# Patient Record
Sex: Male | Born: 2000 | Race: Black or African American | Hispanic: No | Marital: Single | Smoking: Never smoker
Health system: Southern US, Community
[De-identification: ages and names within clinical notes are randomized; demographics above are authoritative.]

---
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
## 2021-01-02 ENCOUNTER — Emergency Department (HOSPITAL_BASED_OUTPATIENT_CLINIC_OR_DEPARTMENT_OTHER): Payer: Medicaid Other

## 2021-01-02 ENCOUNTER — Encounter (HOSPITAL_BASED_OUTPATIENT_CLINIC_OR_DEPARTMENT_OTHER): Payer: Self-pay

## 2021-01-02 ENCOUNTER — Other Ambulatory Visit: Payer: Self-pay

## 2021-01-02 ENCOUNTER — Emergency Department (HOSPITAL_BASED_OUTPATIENT_CLINIC_OR_DEPARTMENT_OTHER)
Admission: EM | Admit: 2021-01-02 | Discharge: 2021-01-02 | Disposition: A | Discharge status: Home / Self Care | Payer: Medicaid Other | Attending: Student | Admitting: Student

## 2021-01-02 DIAGNOSIS — W19XXXA Unspecified fall, initial encounter: Secondary | ICD-10-CM | POA: Insufficient documentation

## 2021-01-02 DIAGNOSIS — Y9302 Activity, running: Secondary | ICD-10-CM | POA: Insufficient documentation

## 2021-01-02 DIAGNOSIS — M25561 Pain in right knee: Secondary | ICD-10-CM

## 2021-01-02 DIAGNOSIS — S8991XA Unspecified injury of right lower leg, initial encounter: Secondary | ICD-10-CM | POA: Diagnosis present

## 2021-01-02 DIAGNOSIS — S8990XA Unspecified injury of unspecified lower leg, initial encounter: Secondary | ICD-10-CM

## 2021-01-02 MED ORDER — IBUPROFEN 400 MG PO TABS
600.0000 mg | ORAL_TABLET | Freq: Once | ORAL | Status: AC
  Administered 2021-01-02: 600 mg via ORAL
  Filled 2021-01-02: qty 1

## 2021-01-02 NOTE — ED Triage Notes (Signed)
Pt reports falling yesterday and injuring right knee

## 2021-01-02 NOTE — ED Provider Notes (Signed)
[facility] EMERGENCY DEPARTMENT Provider Note   CSN: [id number] Arrival date & time: 01/02/21  0848     History Chief Complaint  Patient presents with   Knee Injury    Marc Atkins is a 20 y.o. male who presents emergency department for evaluation of right knee pain.  Patient states that yesterday he was running when he fell onto the kneecap on the right.  He states that he is able to bear weight on the leg but is limited by pain.  His pain is isolated to the kneecap with no surrounding pain, no evidence of external trauma.  No significant knee effusion.  Denies numbness, tingling, weakness of the lower extremity.  Pulses intact.  HPI     History reviewed. No pertinent past medical history.  There are no problems to display for this patient.   History reviewed. No pertinent surgical history.     Family History  Problem Relation Age of Onset   Healthy Mother    Healthy Father     Social History   Tobacco Use   Smoking status: Never  Vaping Use   Vaping Use: Never used  Substance Use Topics   Alcohol use: Never   Drug use: Never    Home Medications Prior to Admission medications   Medication Sig Start Date End Date Taking? Authorizing Provider  naproxen (NAPROSYN) 375 MG tablet Take 1 tablet (375 mg total) by mouth 2 (two) times daily. 12/07/17   Wurst, [place], PA-C    Allergies    Patient has no known allergies.  Review of Systems   Review of Systems  Constitutional:  Negative for chills and fever.  HENT:  Negative for ear pain and sore throat.   Eyes:  Negative for pain and visual disturbance.  Respiratory:  Negative for cough and shortness of breath.   Cardiovascular:  Negative for chest pain and palpitations.  Gastrointestinal:  Negative for abdominal pain and vomiting.  Genitourinary:  Negative for dysuria and hematuria.  Musculoskeletal:  Positive for arthralgias. Negative for back pain.  Skin:  Negative for color change and rash.   Neurological:  Negative for seizures and syncope.  All other systems reviewed and are negative.  Physical Exam Updated Vital Signs BP 104/72 (BP Location: Right Arm)   Pulse 67   Temp 98.6 F (37 C)   Resp 16   Ht 5\' 11"  (1.803 m)   SpO2 97%   Physical Exam Vitals and nursing note reviewed.  Constitutional:      General: He is not in acute distress.    Appearance: He is well-developed.  HENT:     Head: Normocephalic and atraumatic.  Eyes:     Conjunctiva/sclera: Conjunctivae normal.  Cardiovascular:     Rate and Rhythm: Normal rate and regular rhythm.     Heart sounds: No murmur heard. Pulmonary:     Effort: Pulmonary effort is normal. No respiratory distress.     Breath sounds: Normal breath sounds.  Abdominal:     Palpations: Abdomen is soft.     Tenderness: There is no abdominal tenderness.  Musculoskeletal:        General: Tenderness (Right patella) present. No swelling.     Cervical back: Neck supple.  Skin:    General: Skin is warm and dry.     Capillary Refill: Capillary refill takes less than 2 seconds.  Neurological:     Mental Status: He is alert.  Psychiatric:        Mood  and Affect: Mood normal.    ED Results / Procedures / Treatments   Labs (all labs ordered are listed, but only abnormal results are displayed) Labs Reviewed - No data to display  EKG None  Radiology No results found.  Procedures Procedures   Medications Ordered in ED Medications  ibuprofen (ADVIL) tablet 600 mg (has no administration in time range)    ED Course  I have reviewed the triage vital signs and the nursing notes.  Pertinent labs & imaging results that were available during my care of the patient were reviewed by me and considered in my medical decision making (see chart for details).    MDM Rules/Calculators/A&P                           Patient seen emergency department for evaluation of right knee pain.  Physical exam reveals mild tenderness over the  patella of the right knee but no appreciable effusion, patient has full range of motion of the knee.  X-ray with no fracture.  Patient given ibuprofen for pain control and an Ace wrap applied.  Patient able to ambulate upon time of discharge.  Patient then discharged with outpatient PCP follow-up. Final Clinical Impression(s) / ED Diagnoses Final diagnoses:  None    Rx / DC Orders ED Discharge Orders     None        Jeneal Vogl, Wyn Forster, MD 01/02/21 1545

## 2021-03-03 ENCOUNTER — Emergency Department (HOSPITAL_BASED_OUTPATIENT_CLINIC_OR_DEPARTMENT_OTHER)
Admission: EM | Admit: 2021-03-03 | Discharge: 2021-03-03 | Disposition: A | Discharge status: Home / Self Care | Payer: Medicaid Other | Attending: Emergency Medicine | Admitting: Emergency Medicine

## 2021-03-03 ENCOUNTER — Encounter (HOSPITAL_BASED_OUTPATIENT_CLINIC_OR_DEPARTMENT_OTHER): Payer: Self-pay

## 2021-03-03 ENCOUNTER — Other Ambulatory Visit: Payer: Self-pay

## 2021-03-03 ENCOUNTER — Emergency Department (HOSPITAL_BASED_OUTPATIENT_CLINIC_OR_DEPARTMENT_OTHER): Payer: Medicaid Other

## 2021-03-03 DIAGNOSIS — R112 Nausea with vomiting, unspecified: Secondary | ICD-10-CM | POA: Diagnosis present

## 2021-03-03 DIAGNOSIS — Z20822 Contact with and (suspected) exposure to covid-19: Secondary | ICD-10-CM | POA: Insufficient documentation

## 2021-03-03 DIAGNOSIS — E860 Dehydration: Secondary | ICD-10-CM | POA: Diagnosis not present

## 2021-03-03 DIAGNOSIS — R1084 Generalized abdominal pain: Secondary | ICD-10-CM | POA: Insufficient documentation

## 2021-03-03 DIAGNOSIS — N179 Acute kidney failure, unspecified: Secondary | ICD-10-CM | POA: Diagnosis not present

## 2021-03-03 LAB — CBC WITH DIFFERENTIAL/PLATELET
Abs Immature Granulocytes: 0.08 10*3/uL — ABNORMAL HIGH (ref 0.00–0.07)
Basophils Absolute: 0.1 10*3/uL (ref 0.0–0.1)
Basophils Relative: 0 %
Eosinophils Absolute: 0.1 10*3/uL (ref 0.0–0.5)
Eosinophils Relative: 1 %
HCT: 48.9 % (ref 39.0–52.0)
Hemoglobin: 15.5 g/dL (ref 13.0–17.0)
Immature Granulocytes: 0 %
Lymphocytes Relative: 8 %
Lymphs Abs: 1.4 10*3/uL (ref 0.7–4.0)
MCH: 25.5 pg — ABNORMAL LOW (ref 26.0–34.0)
MCHC: 31.7 g/dL (ref 30.0–36.0)
MCV: 80.6 fL (ref 80.0–100.0)
Monocytes Absolute: 1.5 10*3/uL — ABNORMAL HIGH (ref 0.1–1.0)
Monocytes Relative: 8 %
Neutro Abs: 14.9 10*3/uL — ABNORMAL HIGH (ref 1.7–7.7)
Neutrophils Relative %: 83 %
Platelets: 361 10*3/uL (ref 150–400)
RBC: 6.07 MIL/uL — ABNORMAL HIGH (ref 4.22–5.81)
RDW: 14.1 % (ref 11.5–15.5)
WBC: 18.1 10*3/uL — ABNORMAL HIGH (ref 4.0–10.5)
nRBC: 0 % (ref 0.0–0.2)

## 2021-03-03 LAB — COMPREHENSIVE METABOLIC PANEL
ALT: 35 U/L (ref 0–44)
AST: 24 U/L (ref 15–41)
Albumin: 4.6 g/dL (ref 3.5–5.0)
Alkaline Phosphatase: 49 U/L (ref 38–126)
Anion gap: 10 (ref 5–15)
BUN: 17 mg/dL (ref 6–20)
CO2: 26 mmol/L (ref 22–32)
Calcium: 9.6 mg/dL (ref 8.9–10.3)
Chloride: 102 mmol/L (ref 98–111)
Creatinine, Ser: 1.25 mg/dL — ABNORMAL HIGH (ref 0.61–1.24)
GFR, Estimated: >60 mL/min (ref >60)
Glucose, Bld: 126 mg/dL — ABNORMAL HIGH (ref 70–99)
Potassium: 3.8 mmol/L (ref 3.5–5.1)
Sodium: 138 mmol/L (ref 135–145)
Total Bilirubin: 0.7 mg/dL (ref 0.3–1.2)
Total Protein: 8.4 g/dL — ABNORMAL HIGH (ref 6.5–8.1)

## 2021-03-03 LAB — LIPASE, BLOOD: Lipase: 26 U/L (ref 11–51)

## 2021-03-03 LAB — RESP PANEL BY RT-PCR (FLU A&B, COVID) ARPGX2
Influenza A by PCR: NEGATIVE
Influenza B by PCR: NEGATIVE
SARS Coronavirus 2 by RT PCR: NEGATIVE

## 2021-03-03 LAB — MAGNESIUM: Magnesium: 1.8 mg/dL (ref 1.7–2.4)

## 2021-03-03 MED ORDER — DIPHENHYDRAMINE HCL 25 MG PO CAPS
25.0000 mg | ORAL_CAPSULE | Freq: Once | ORAL | Status: AC
  Administered 2021-03-03: 25 mg via ORAL
  Filled 2021-03-03: qty 1

## 2021-03-03 MED ORDER — ONDANSETRON HCL 4 MG/2ML IJ SOLN
4.0000 mg | Freq: Once | INTRAMUSCULAR | Status: AC
  Administered 2021-03-03: 4 mg via INTRAVENOUS
  Filled 2021-03-03: qty 2

## 2021-03-03 MED ORDER — SODIUM CHLORIDE 0.9 % IV BOLUS
1000.0000 mL | Freq: Once | INTRAVENOUS | Status: AC
  Administered 2021-03-03: 1000 mL via INTRAVENOUS

## 2021-03-03 MED ORDER — IOHEXOL 300 MG/ML  SOLN
100.0000 mL | Freq: Once | INTRAMUSCULAR | Status: AC | PRN
  Administered 2021-03-03: 100 mL via INTRAVENOUS

## 2021-03-03 MED ORDER — METOCLOPRAMIDE HCL 10 MG PO TABS
10.0000 mg | ORAL_TABLET | Freq: Once | ORAL | Status: AC
Start: 2021-03-03 — End: 2021-03-03
  Administered 2021-03-03: 10 mg via ORAL
  Filled 2021-03-03: qty 1

## 2021-03-03 MED ORDER — ONDANSETRON HCL 4 MG PO TABS: 4.0000 mg | ORAL_TABLET | Freq: Four times a day (QID) | ORAL | 0 refills | Status: AC

## 2021-03-03 NOTE — ED Notes (Signed)
Pt in bed, pt denies pain or nausea, crackers and water given for PO challenge.

## 2021-03-03 NOTE — ED Triage Notes (Signed)
Pt to er room number 6, per ems pt is here for nausea and vomiting since 4am, pt states that he thinks that he has a stomach bug, states that he has vomited about 6 times since 4 am, states that he also has some abd pain.  Pt c/o nausea at this time.

## 2021-03-03 NOTE — ED Notes (Signed)
Pt in bed, pt states that he feels better, denies nausea or pain

## 2021-03-03 NOTE — ED Provider Notes (Signed)
[facility] EM[facility]ENCY DEPARTMENT Provider Note   CSN: [id number] Arrival date & time: 03/03/21  [id number]     History  Chief Complaint  Patient presents with   Emesis    Marc Atkins is a 22 y.o. male.  Pt is a 21 yo male with no significant PMH presenting for nausea and vomiting. Patient admits to generalized abdominal discomfort while vomiting. State he woke up at 4 am vomiting, approx 5-6 times. Denis fevers, chills, or diarrhea. Had canned chili peppers last night before bed. Denies sick contacts. Denies abdominal surgical hx. Denies RLQ pain. Denies marijuana use. Denies alcohol use.   The history is provided by the patient. No language interpreter was used.  Emesis Associated symptoms: abdominal pain   Associated symptoms: no arthralgias, no chills, no cough, no fever and no sore throat       Home Medications Prior to Admission medications   Medication Sig Start Date End Date Taking? Authorizing Provider  naproxen (NAPROSYN) 375 MG tablet Take 1 tablet (375 mg total) by mouth 2 (two) times daily. 12/07/17   Wurst, [place], PA-C      Allergies    Patient has no known allergies.    Review of Systems   Review of Systems  Constitutional:  Negative for chills and fever.  HENT:  Negative for ear pain and sore throat.   Eyes:  Negative for pain and visual disturbance.  Respiratory:  Negative for cough and shortness of breath.   Cardiovascular:  Negative for chest pain and palpitations.  Gastrointestinal:  Positive for abdominal pain, nausea and vomiting.  Genitourinary:  Negative for dysuria and hematuria.  Musculoskeletal:  Negative for arthralgias and back pain.  Skin:  Negative for color change and rash.  Neurological:  Negative for seizures and syncope.  All other systems reviewed and are negative.  Physical Exam Updated Vital Signs BP 117/73 (BP Location: Left Arm)    Pulse 80    Temp 98.5 F (36.9 C) (Oral)    Resp 17    Ht 5\' 11"  (1.803 m)    Wt 97.5  kg    SpO2 100%    BMI 29.99 kg/m  Physical Exam Vitals and nursing note reviewed.  Constitutional:      General: He is not in acute distress.    Appearance: He is well-developed.  HENT:     Head: Normocephalic and atraumatic.  Eyes:     Conjunctiva/sclera: Conjunctivae normal.  Cardiovascular:     Rate and Rhythm: Normal rate and regular rhythm.     Heart sounds: No murmur heard. Pulmonary:     Effort: Pulmonary effort is normal. No respiratory distress.     Breath sounds: Normal breath sounds.  Abdominal:     Palpations: Abdomen is soft.     Tenderness: There is no abdominal tenderness.  Musculoskeletal:        General: No swelling.     Cervical back: Neck supple.  Skin:    General: Skin is warm and dry.     Capillary Refill: Capillary refill takes less than 2 seconds.  Neurological:     Mental Status: He is alert.  Psychiatric:        Mood and Affect: Mood normal.    ED Results / Procedures / Treatments   Labs (all labs ordered are listed, but only abnormal results are displayed) Labs Reviewed - No data to display  EKG None  Radiology No results found.  Procedures Procedures    Medications  Ordered in ED Medications - No data to display  ED Course/ Medical Decision Making/ A&P                           Medical Decision Making Amount and/or Complexity of Data Reviewed Labs: ordered. Radiology: ordered.  Risk Prescription drug management.   7:44 AM 21 yo male with no significant PMH presenting for nausea and vomiting. Pt is [name], no acute distress, afebrile, with stable vitals. Abdomen is soft and on tender.   Stable liver profile and lipase. Pt has AKI likely secondary to dehydration. CT abdomen demonstrates no appendicitis. No acute process on exam. No sepsis. Improvement of symptoms after IVF and zofran. Consider gastroenteritis of unknown cause.   Patient in no distress and overall condition improved here in the ED. Detailed discussions were had  with the patient regarding current findings, and need for close f/u with PCP or on call doctor. The patient has been instructed to return immediately if the symptoms worsen in any way for re-evaluation. Patient verbalized understanding and is in agreement with current care plan. All questions answered prior to discharge.         Final Clinical Impression(s) / ED Diagnoses Final diagnoses:  Nausea and vomiting, unspecified vomiting type  Dehydration  AKI (acute kidney injury) [facility])    Rx / DC Orders ED Discharge Orders     None         Lianne Cure, DO [id number]

## 2021-03-03 NOTE — ED Notes (Signed)
Pt in bed, pt vomited a large amount of green liquid.

## 2022-06-02 ENCOUNTER — Other Ambulatory Visit: Payer: Self-pay

## 2022-06-02 ENCOUNTER — Emergency Department (HOSPITAL_BASED_OUTPATIENT_CLINIC_OR_DEPARTMENT_OTHER)
Admission: EM | Admit: 2022-06-02 | Discharge: 2022-06-02 | Disposition: A | Discharge status: Home / Self Care | Payer: Medicaid Other | Attending: Emergency Medicine | Admitting: Emergency Medicine

## 2022-06-02 ENCOUNTER — Encounter (HOSPITAL_BASED_OUTPATIENT_CLINIC_OR_DEPARTMENT_OTHER): Payer: Self-pay

## 2022-06-02 DIAGNOSIS — D72829 Elevated white blood cell count, unspecified: Secondary | ICD-10-CM | POA: Insufficient documentation

## 2022-06-02 DIAGNOSIS — R112 Nausea with vomiting, unspecified: Secondary | ICD-10-CM | POA: Diagnosis present

## 2022-06-02 DIAGNOSIS — E876 Hypokalemia: Secondary | ICD-10-CM | POA: Insufficient documentation

## 2022-06-02 DIAGNOSIS — T40715A Adverse effect of cannabis, initial encounter: Secondary | ICD-10-CM | POA: Insufficient documentation

## 2022-06-02 LAB — COMPREHENSIVE METABOLIC PANEL
ALT: 29 U/L (ref 0–44)
AST: 24 U/L (ref 15–41)
Albumin: 4 g/dL (ref 3.5–5.0)
Alkaline Phosphatase: 49 U/L (ref 38–126)
Anion gap: 9 (ref 5–15)
BUN: 14 mg/dL (ref 6–20)
CO2: 24 mmol/L (ref 22–32)
Calcium: 8.8 mg/dL — ABNORMAL LOW (ref 8.9–10.3)
Chloride: 102 mmol/L (ref 98–111)
Creatinine, Ser: 1.2 mg/dL (ref 0.61–1.24)
GFR, Estimated: >60 mL/min (ref >60)
Glucose, Bld: 118 mg/dL — ABNORMAL HIGH (ref 70–99)
Potassium: 3.2 mmol/L — ABNORMAL LOW (ref 3.5–5.1)
Sodium: 135 mmol/L (ref 135–145)
Total Bilirubin: 0.7 mg/dL (ref 0.3–1.2)
Total Protein: 7.4 g/dL (ref 6.5–8.1)

## 2022-06-02 LAB — CBC
HCT: 43.2 % (ref 39.0–52.0)
Hemoglobin: 13.8 g/dL (ref 13.0–17.0)
MCH: 25 pg — ABNORMAL LOW (ref 26.0–34.0)
MCHC: 31.9 g/dL (ref 30.0–36.0)
MCV: 78.1 fL — ABNORMAL LOW (ref 80.0–100.0)
Platelets: 414 10*3/uL — ABNORMAL HIGH (ref 150–400)
RBC: 5.53 MIL/uL (ref 4.22–5.81)
RDW: 14.3 % (ref 11.5–15.5)
WBC: 15.8 10*3/uL — ABNORMAL HIGH (ref 4.0–10.5)
nRBC: 0 % (ref 0.0–0.2)

## 2022-06-02 LAB — MAGNESIUM: Magnesium: 1.8 mg/dL (ref 1.7–2.4)

## 2022-06-02 LAB — LIPASE, BLOOD: Lipase: 23 U/L (ref 11–51)

## 2022-06-02 MED ORDER — POTASSIUM CHLORIDE CRYS ER 20 MEQ PO TBCR
20.0000 meq | EXTENDED_RELEASE_TABLET | Freq: Once | ORAL | Status: AC
Start: 2022-06-02 — End: 2022-06-02
  Administered 2022-06-02: 20 meq via ORAL
  Filled 2022-06-02: qty 1

## 2022-06-02 MED ORDER — ONDANSETRON 4 MG PO TBDP: 4.0000 mg | ORAL_TABLET | Freq: Three times a day (TID) | ORAL | 0 refills | Status: AC | PRN

## 2022-06-02 MED ORDER — LACTATED RINGERS IV BOLUS
1000.0000 mL | Freq: Once | INTRAVENOUS | Status: AC
  Administered 2022-06-02: 1000 mL via INTRAVENOUS

## 2022-06-02 MED ORDER — ONDANSETRON HCL 4 MG/2ML IJ SOLN
4.0000 mg | Freq: Once | INTRAMUSCULAR | Status: AC
  Administered 2022-06-02: 4 mg via INTRAVENOUS
  Filled 2022-06-02: qty 2

## 2022-06-02 NOTE — ED Notes (Signed)
Ice chips provided.

## 2022-06-02 NOTE — ED Triage Notes (Signed)
Pt reports that he went to a smoke shop tonight and took 2 edibles and then began vomiting, having dizziness and palpations

## 2022-06-02 NOTE — ED Provider Notes (Signed)
[facility] Provider Note   CSN: [id number] Arrival date & time: 06/02/22  0225     History  Chief Complaint  Patient presents with   Abdominal Pain    Marc Atkins is a 22 y.o. male.  The history is provided by the patient.  Abdominal Pain Marc Atkins is a 22 y.o. male who presents to the Emergency Department complaining of vomiting.  He presents to the emergency department accompanied by his grandmother for evaluation of vomiting that started this evening.  Around 10 PM he ate 2 edibles and then a little while later he developed nausea, vomiting and feels shaky.  He has had 2 episodes of emesis.  He has never tried an edible before.  He received these from a local smoke shop.  No associate abdominal pain, fevers, difficulty breathing, diarrhea.  He has no known medical problems.      Home Medications Prior to Admission medications   Medication Sig Start Date End Date Taking? Authorizing Provider  naproxen (NAPROSYN) 375 MG tablet Take 1 tablet (375 mg total) by mouth 2 (two) times daily. 12/07/17   Wurst, [place], PA-C  ondansetron (ZOFRAN) 4 MG tablet Take 1 tablet (4 mg total) by mouth every 6 (six) hours. 03/03/21   Franne Forts, DO      Allergies    Patient has no known allergies.    Review of Systems   Review of Systems  Gastrointestinal:  Positive for abdominal pain.  All other systems reviewed and are negative.   Physical Exam Updated Vital Signs BP 135/82   Pulse 91   Temp 98 F (36.7 C) (Oral)   Resp 18   Ht 6' (1.829 m)   SpO2 100%   BMI 29.16 kg/m  Physical Exam Vitals and nursing note reviewed.  Constitutional:      Appearance: He is well-developed.  HENT:     Head: Normocephalic and atraumatic.  Cardiovascular:     Rate and Rhythm: Normal rate and regular rhythm.  Pulmonary:     Effort: Pulmonary effort is normal. No respiratory distress.  Abdominal:     Palpations: Abdomen is soft.      Tenderness: There is no abdominal tenderness. There is no guarding or rebound.  Musculoskeletal:        General: No tenderness.  Skin:    General: Skin is warm and dry.  Neurological:     Mental Status: He is alert and oriented to person, place, and time.  Psychiatric:        Behavior: Behavior normal.     ED Results / Procedures / Treatments   Labs (all labs ordered are listed, but only abnormal results are displayed) Labs Reviewed  COMPREHENSIVE METABOLIC PANEL - Abnormal; Notable for the following components:      Result Value   Potassium 3.2 (*)    Glucose, Bld 118 (*)    Calcium 8.8 (*)    All other components within normal limits  CBC - Abnormal; Notable for the following components:   WBC 15.8 (*)    MCV 78.1 (*)    MCH 25.0 (*)    Platelets 414 (*)    All other components within normal limits  LIPASE, BLOOD  URINALYSIS, ROUTINE W REFLEX MICROSCOPIC    EKG EKG Interpretation  Date/Time:  Wednesday Jun 02 2022 02:35:08 EDT Ventricular Rate:  98 PR Interval:  170 QRS Duration: 97 QT Interval:  339 QTC Calculation: 433 R  Axis:   52 Text Interpretation: Sinus rhythm ST elev, probable normal early repol pattern Confirmed by Tilden Fossa [phone]) on 06/02/2022 3:02:49 AM  Radiology No results found.  Procedures Procedures    Medications Ordered in ED Medications - No data to display  ED Course/ Medical Decision Making/ A&P                             Medical Decision Making Amount and/or Complexity of Data Reviewed Labs: ordered.  Risk Prescription drug management.   Patient here for evaluation of emesis after ingesting a THC edible from a commercial smoke shop.  He is nontoxic-appearing on evaluation with no abdominal tenderness.  Labs significant for mild hypokalemia-this was replaced orally.  He does have mild leukocytosis on CBC, history of same in the past.  He has no acute abdominal pain.  Suspect leukocytosis is either  chronic or secondary to  demargination as there is no evidence of acute infectious process at this time.  He was treated with antiemetic and IV fluid in the emergency department with improvement in his symptoms and no recurrent vomiting in the department.  He is able to tolerate p.o. without difficulty.  Plan to discharge home with home care for nausea and vomiting.  Recommend that he not take THC edibles in the future.  Will prescribe ondansetron as needed if he has recurrent nausea or vomiting.        Final Clinical Impression(s) / ED Diagnoses Final diagnoses:  None    Rx / DC Orders ED Discharge Orders     None         Tilden Fossa, MD 06/02/22 [phone]

## 2023-09-09 IMAGING — DX DG KNEE COMPLETE 4+V*R*
4 series · 4 of 4 positions shown · non-contrast
Comparison: None.

CLINICAL DATA: Status post fall, right knee pain and swelling

EXAM:
RIGHT KNEE - COMPLETE 4+ VIEW

[knee ap]
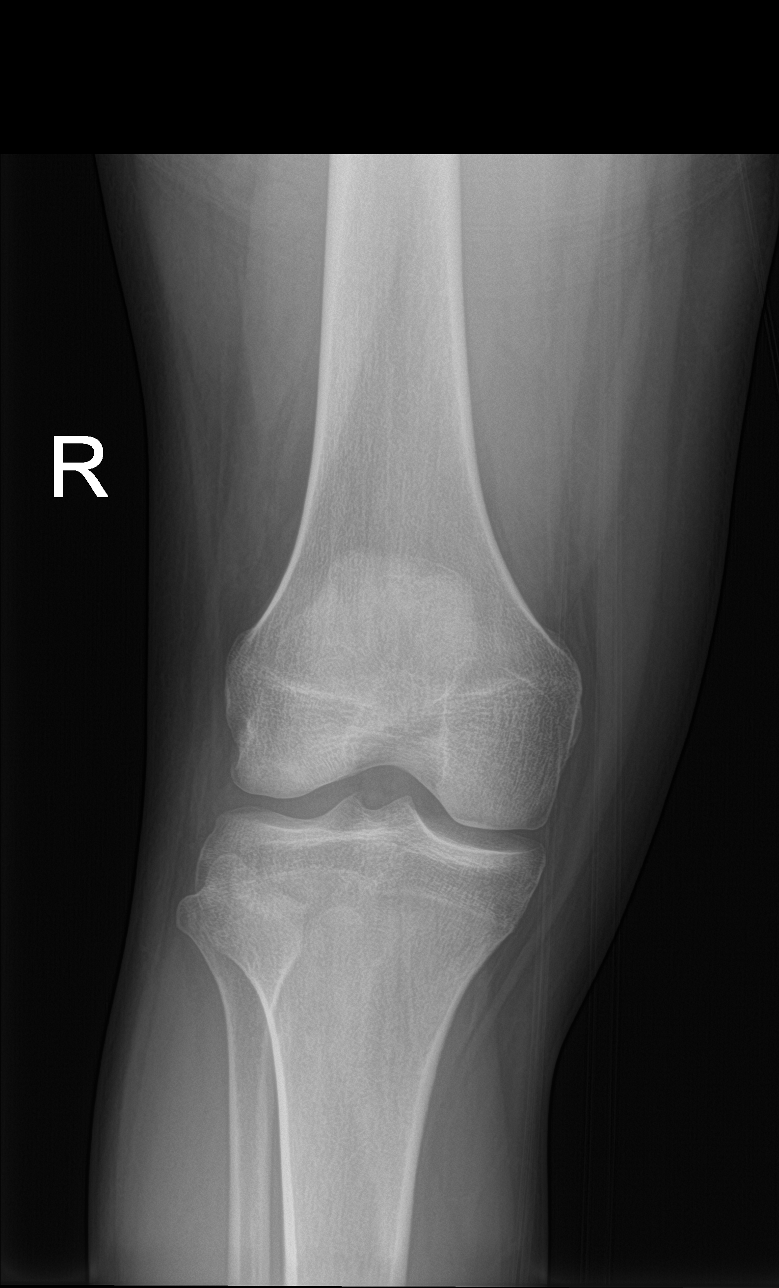

[knee lat]
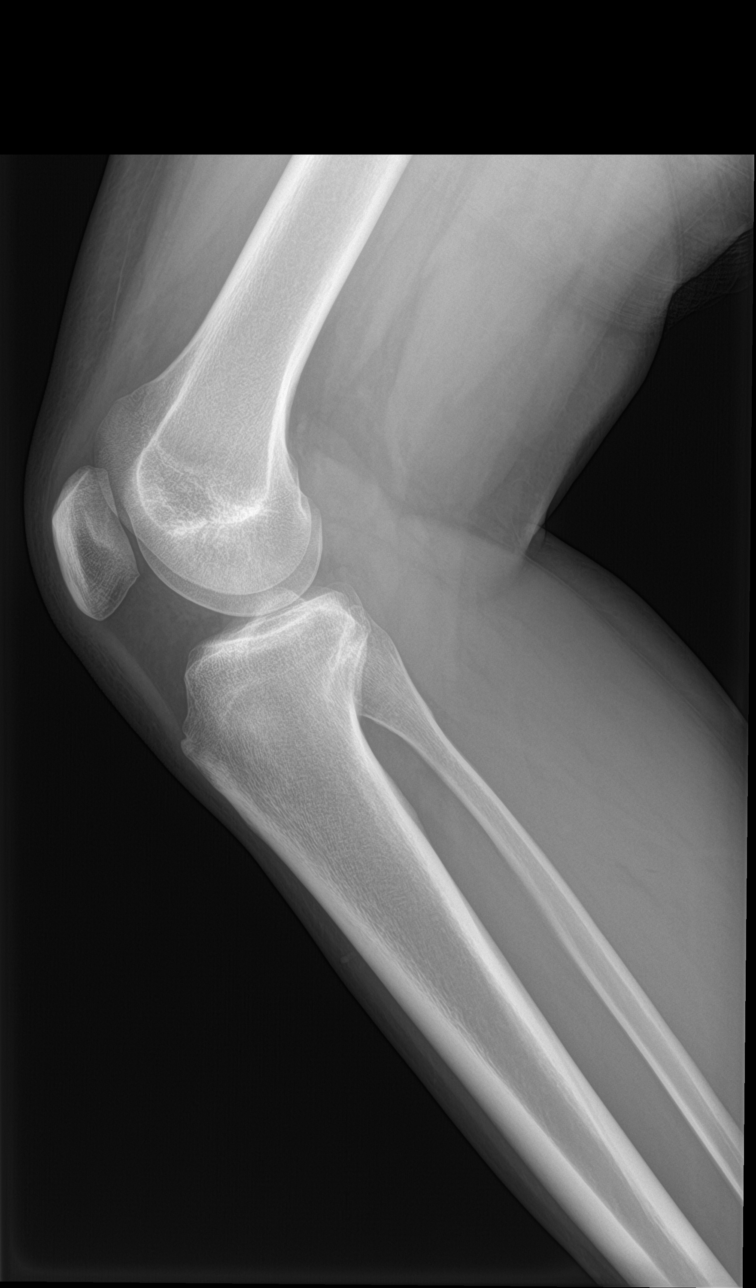

[knee obl (1 of 2)]
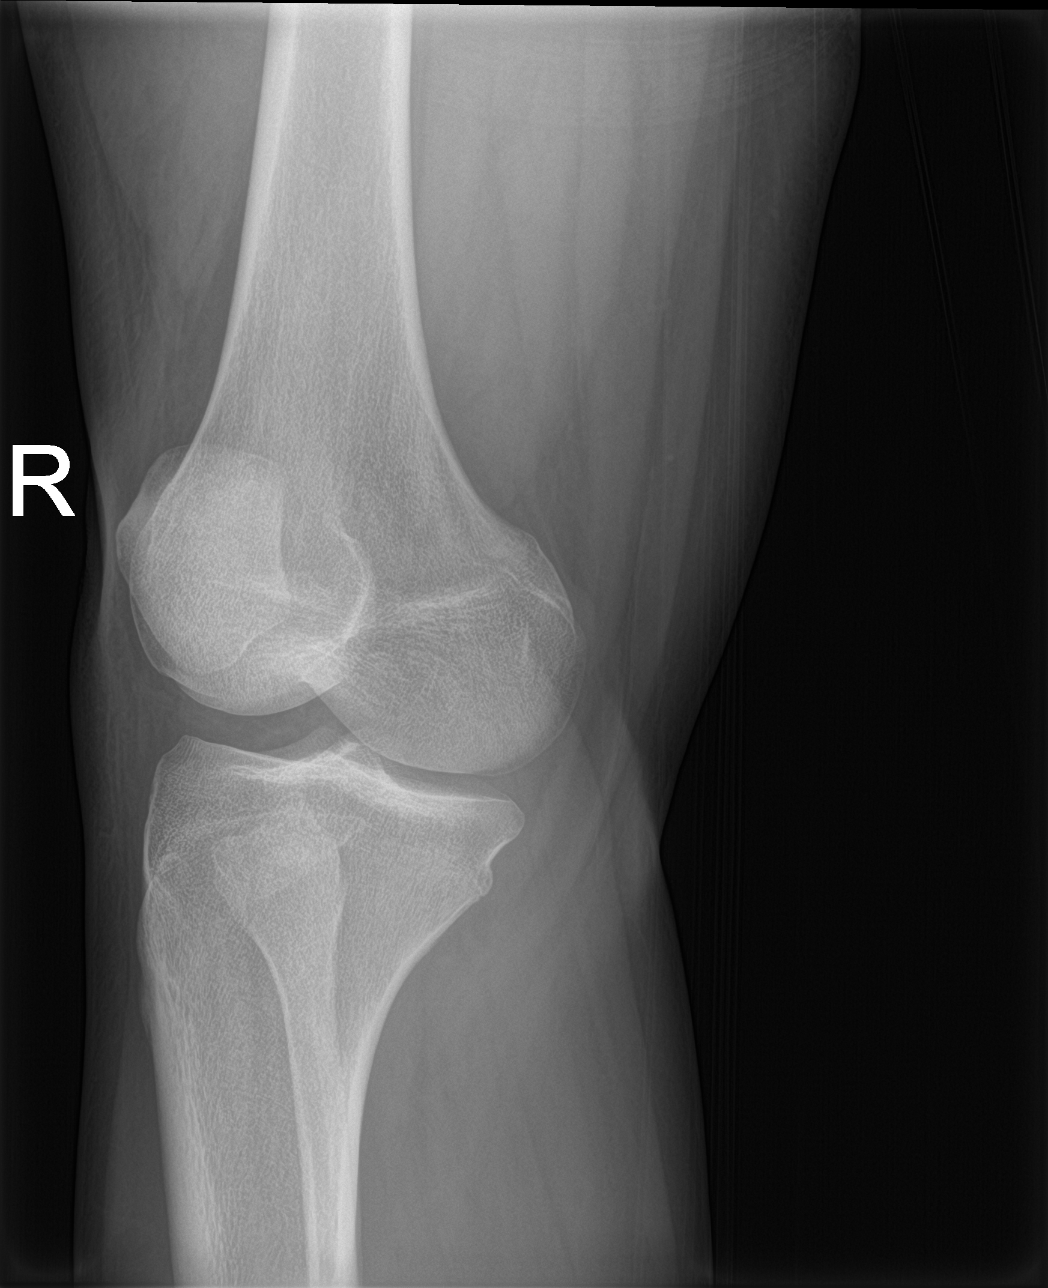

[knee obl (2 of 2)]
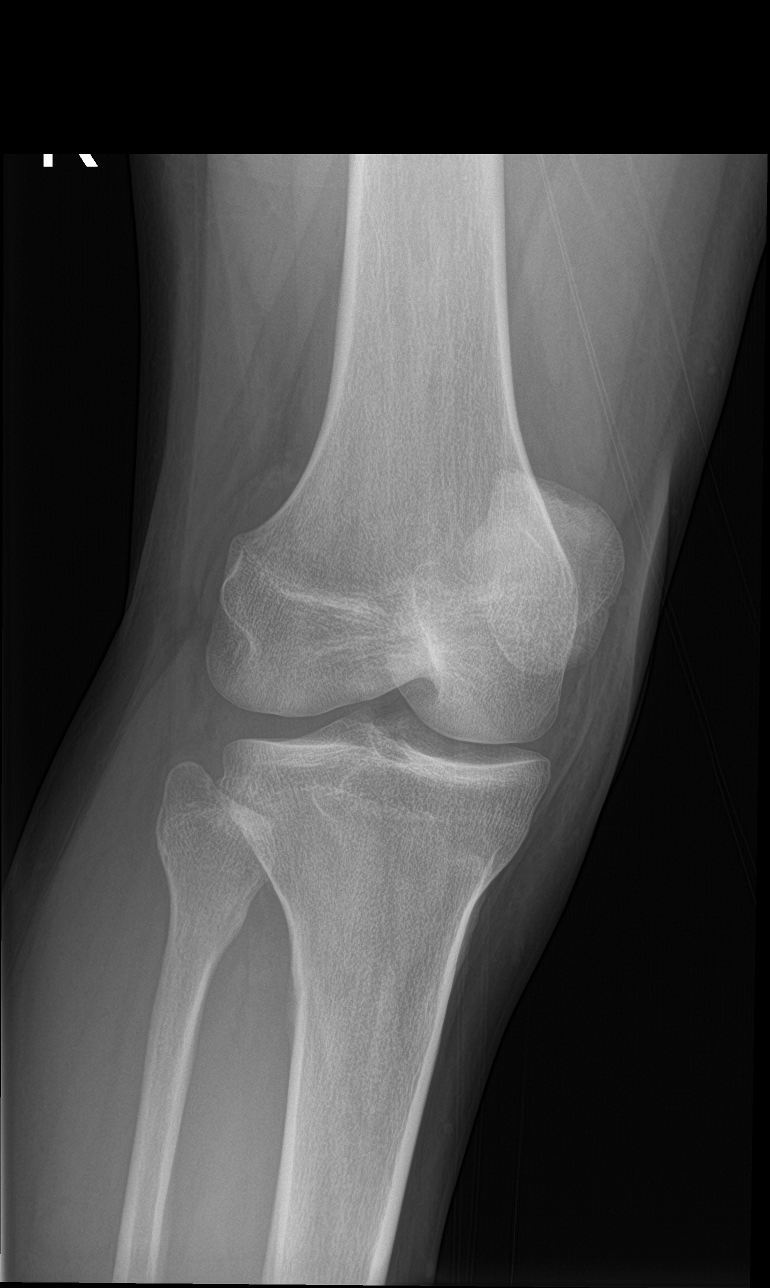

[4 of 4 positions shown; findings below may reference images not displayed]

FINDINGS: No evidence of fracture, dislocation, or joint effusion. No evidence
of arthropathy or other focal bone abnormality. Soft tissues are
unremarkable.
IMPRESSION: Negative.

## 2023-11-08 IMAGING — CT CT ABD-PELV W/ CM
2 of 4 series · 16 of 46 positions shown, 18 images · IV contrast (Omnipaque)
Comparison: None.

CLINICAL DATA: Abdominal pain. Acute nonlocalized. Periumbilical
pain. Concern for appendicitis.

EXAM:
CT ABDOMEN AND PELVIS WITH CONTRAST
TECHNIQUE: Multidetector CT imaging of the abdomen and pelvis was performed
using the standard protocol following bolus administration of
intravenous contrast.

[Series 2: axial st · axial · 0.95mm/px · z∈[-550,-80]mm · 13 of 104 slices shown, 15 images]
[im 5/104  soft-tissue]
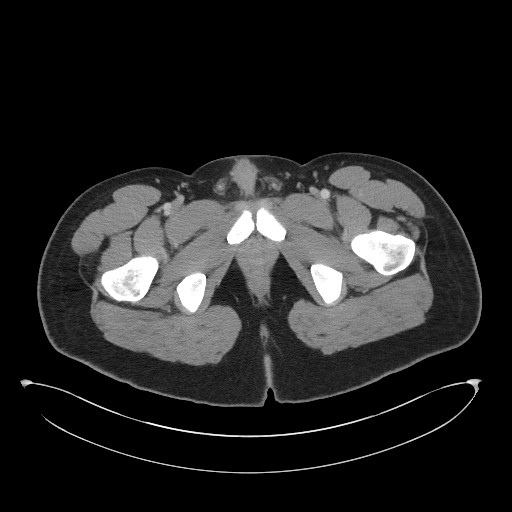
[im 5/104  bone]
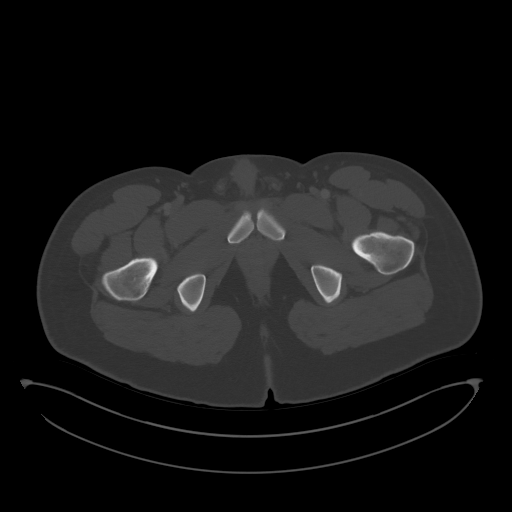
[im 13/104  soft-tissue]
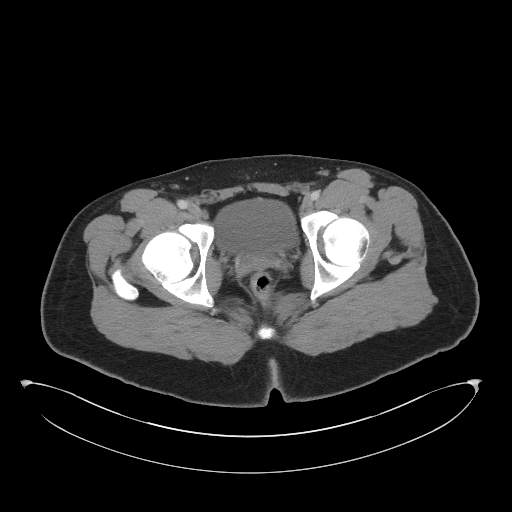
[im 22/104  soft-tissue]
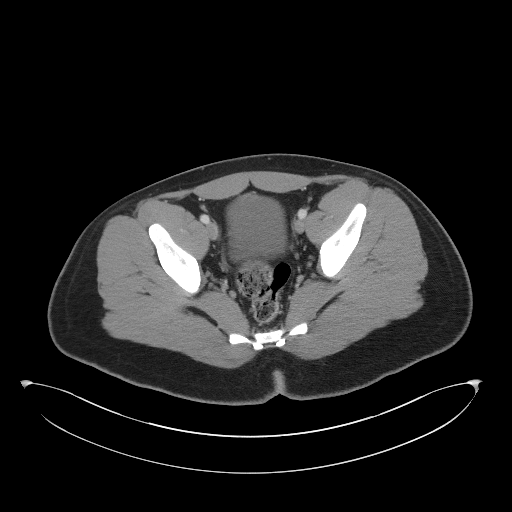
[im 31/104  soft-tissue]
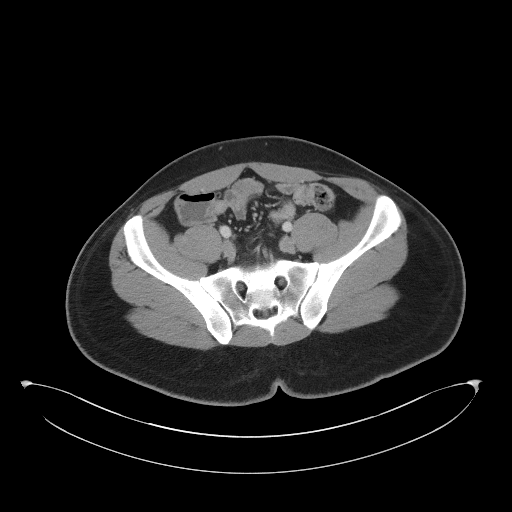
[im 35/104  soft-tissue]
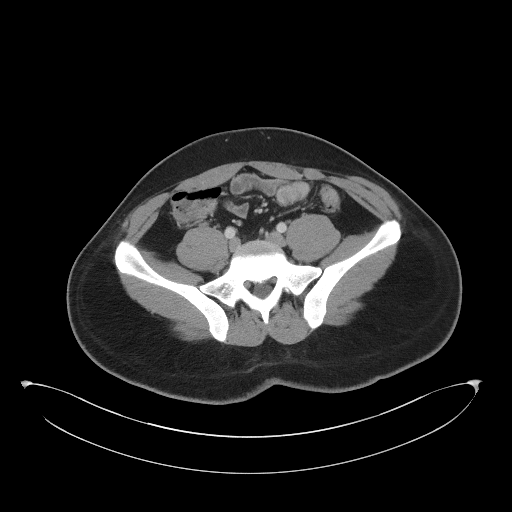
[im 43/104  soft-tissue]
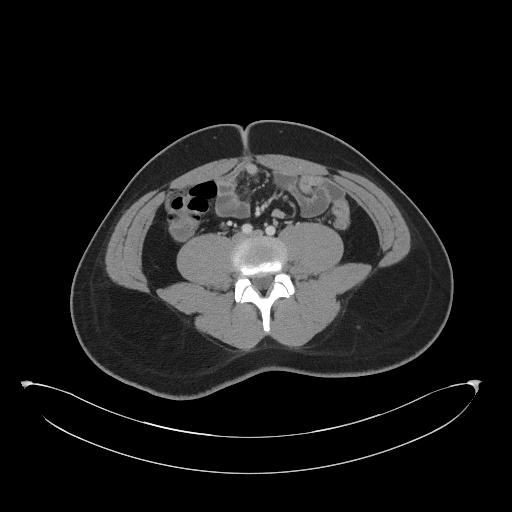
[im 52/104  soft-tissue]
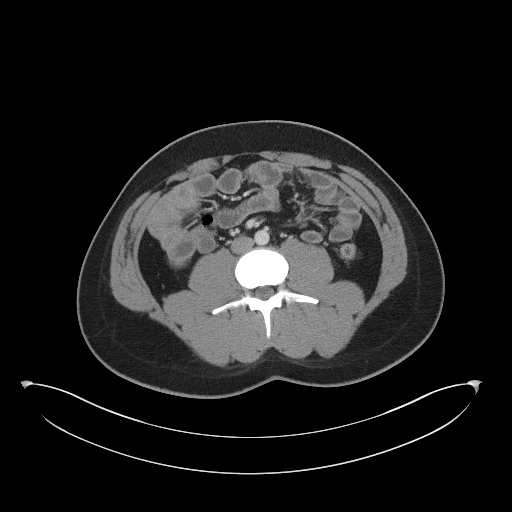
[im 61/104  soft-tissue]
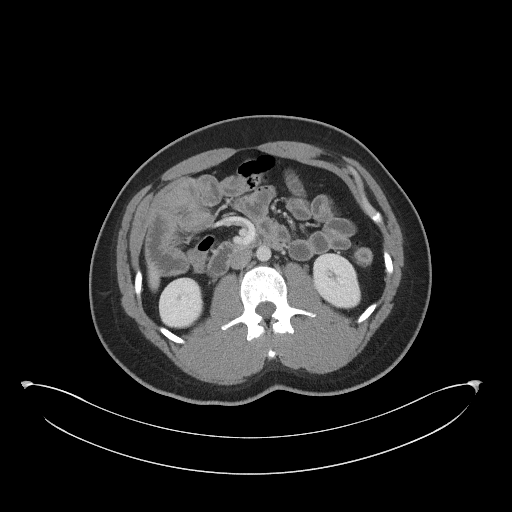
[im 69/104  soft-tissue]
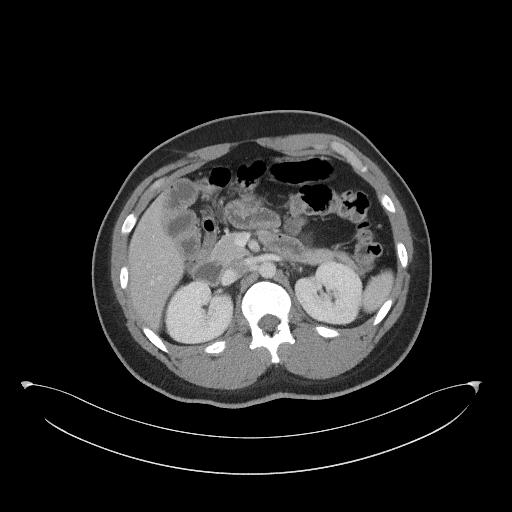
[im 69/104  bone]
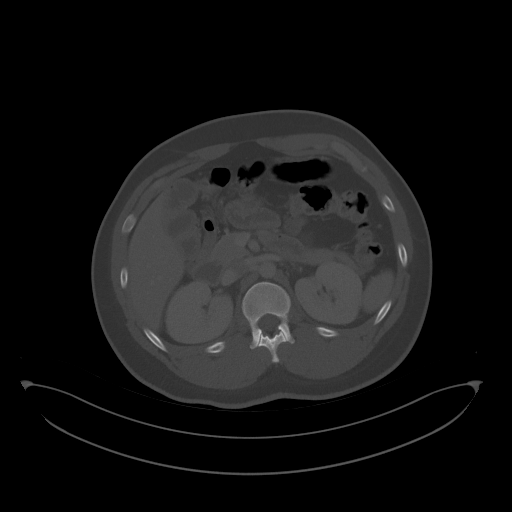
[im 73/104  soft-tissue]
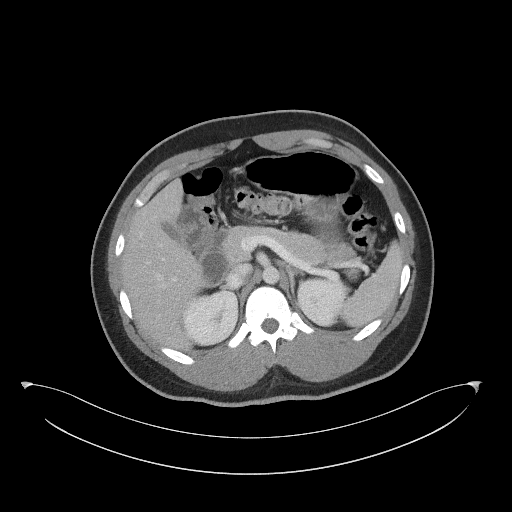
[im 82/104  soft-tissue]
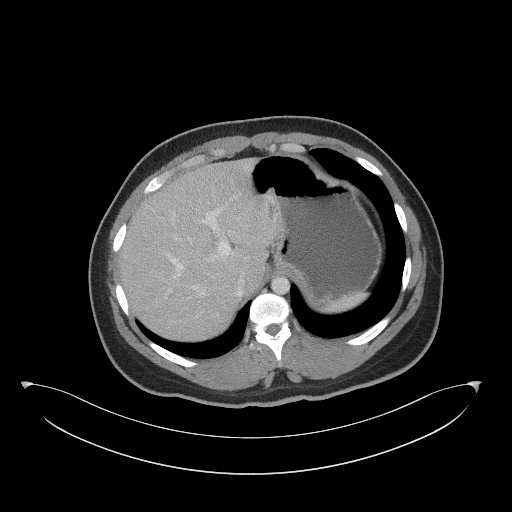
[im 91/104  soft-tissue]
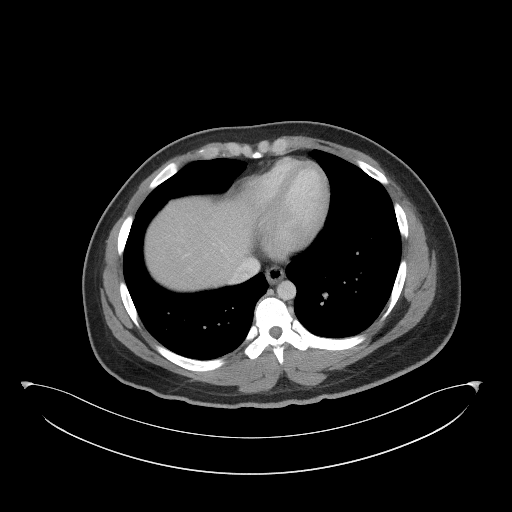
[im 99/104  soft-tissue]
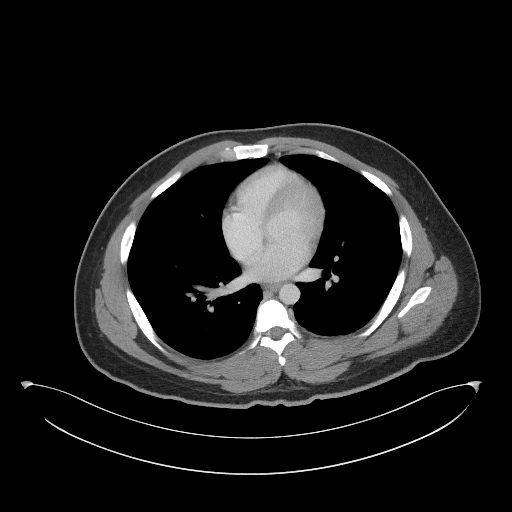

[Series 5: coronal st · coronal · 0.80mm/px · 3 of 95 slices shown]
[im 32/95  soft-tissue]
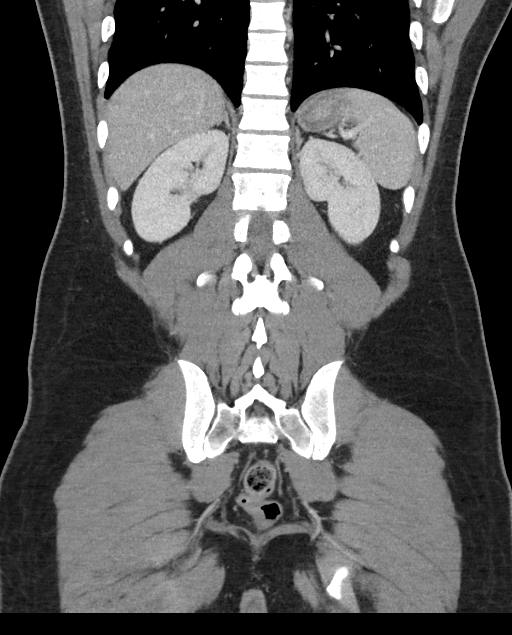
[im 42/95  soft-tissue]
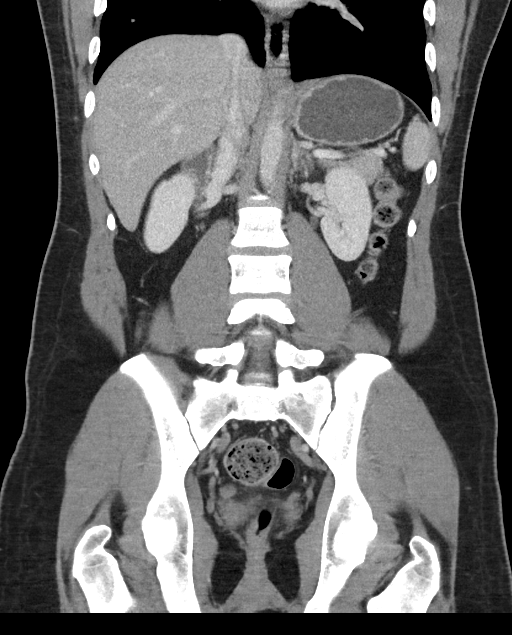
[im 53/95  soft-tissue]
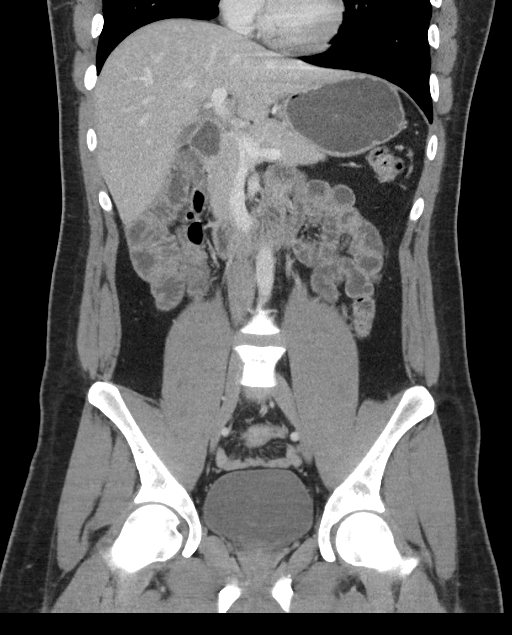

[16 of 46 positions shown; findings below may reference images not displayed]

RADIATION DOSE REDUCTION: This exam was performed according to the
departmental dose-optimization program which includes automated
exposure control, adjustment of the mA and/or kV according to
patient size and/or use of iterative reconstruction technique.

CONTRAST:  100mL OMNIPAQUE IOHEXOL 300 MG/ML  SOLN
FINDINGS: Lower chest: Lung bases are clear.

Hepatobiliary: Fatty infiltration along the falciform ligament. No
biliary duct dilatation. Gallbladder normal.

Pancreas: Pancreas is normal. No ductal dilatation. No pancreatic
inflammation.

Spleen: Normal spleen

Adrenals/urinary tract: Adrenal glands and kidneys are normal. The
ureters and bladder normal.

Stomach/Bowel: Stomach, small cecum normal. Normal appendix noted on
image 65/2. No evidence of appendicitis. The colon and rectosigmoid
colon are normal.

Vascular/Lymphatic: Abdominal aorta is normal caliber. No periportal
or retroperitoneal adenopathy. No pelvic adenopathy.

Reproductive: Unremarkable

Other: No free fluid.

Musculoskeletal: No aggressive osseous lesion.
IMPRESSION: 1. Normal appendix.
2. Normal gallbladder.
3. No obstructive uropathy.
